# Patient Record
Sex: Female | Born: 1980 | Race: Black or African American | Hispanic: No | Marital: Married | Smoking: Never smoker
Health system: Southern US, Community
[De-identification: ages and names within clinical notes are randomized; demographics above are authoritative.]

## PROBLEM LIST (undated history)

## (undated) ENCOUNTER — Inpatient Hospital Stay (HOSPITAL_COMMUNITY): Payer: Self-pay

## (undated) DIAGNOSIS — J45909 Unspecified asthma, uncomplicated: Secondary | ICD-10-CM

## (undated) DIAGNOSIS — K219 Gastro-esophageal reflux disease without esophagitis: Secondary | ICD-10-CM

## (undated) HISTORY — PX: WISDOM TOOTH EXTRACTION: SHX21

## (undated) HISTORY — PX: TONSILLECTOMY: SUR1361

---

## 2018-06-21 ENCOUNTER — Encounter (HOSPITAL_COMMUNITY): Payer: Self-pay | Admitting: *Deleted

## 2018-06-21 ENCOUNTER — Inpatient Hospital Stay (HOSPITAL_COMMUNITY)
Admission: AD | Admit: 2018-06-21 | Discharge: 2018-06-21 | Disposition: A | Discharge status: Home / Self Care | Payer: BC Managed Care – PPO | Attending: Obstetrics & Gynecology | Admitting: Obstetrics & Gynecology

## 2018-06-21 ENCOUNTER — Other Ambulatory Visit: Payer: Self-pay

## 2018-06-21 DIAGNOSIS — O210 Mild hyperemesis gravidarum: Secondary | ICD-10-CM | POA: Insufficient documentation

## 2018-06-21 DIAGNOSIS — O2341 Unspecified infection of urinary tract in pregnancy, first trimester: Secondary | ICD-10-CM | POA: Insufficient documentation

## 2018-06-21 DIAGNOSIS — Z3A01 Less than 8 weeks gestation of pregnancy: Secondary | ICD-10-CM | POA: Diagnosis not present

## 2018-06-21 HISTORY — DX: Unspecified asthma, uncomplicated: J45.909

## 2018-06-21 LAB — POCT PREGNANCY, URINE: Preg Test, Ur: POSITIVE — AB

## 2018-06-21 LAB — URINALYSIS, ROUTINE W REFLEX MICROSCOPIC
Bilirubin Urine: NEGATIVE
Glucose, UA: NEGATIVE mg/dL
Hgb urine dipstick: NEGATIVE
Ketones, ur: 40 mg/dL — AB
Leukocytes,Ua: NEGATIVE
Nitrite: NEGATIVE
PH: 5.5 (ref 5.0–8.0)
Protein, ur: NEGATIVE mg/dL
Specific Gravity, Urine: >1.03 — ABNORMAL HIGH (ref 1.005–1.030)

## 2018-06-21 MED ORDER — SODIUM CHLORIDE 0.9 % IV SOLN
Freq: Once | INTRAVENOUS | Status: AC
  Administered 2018-06-21: 17:00:00 via INTRAVENOUS

## 2018-06-21 MED ORDER — SCOPOLAMINE 1 MG/3DAYS TD PT72
1.0000 | MEDICATED_PATCH | TRANSDERMAL | Status: DC
  Administered 2018-06-21: 1.5 mg via TRANSDERMAL
  Filled 2018-06-21: qty 1

## 2018-06-21 MED ORDER — PROMETHAZINE HCL 25 MG/ML IJ SOLN
25.0000 mg | Freq: Once | INTRAVENOUS | Status: AC
  Administered 2018-06-21: 25 mg via INTRAVENOUS
  Filled 2018-06-21: qty 1

## 2018-06-21 MED ORDER — SODIUM CHLORIDE 0.9 % IV SOLN
1.0000 g | INTRAVENOUS | Status: DC
  Administered 2018-06-21: 1 g via INTRAVENOUS
  Filled 2018-06-21: qty 10

## 2018-06-21 MED ORDER — M.V.I. ADULT IV INJ
Freq: Once | INTRAVENOUS | Status: AC
  Administered 2018-06-21: 20:00:00 via INTRAVENOUS
  Filled 2018-06-21: qty 1000

## 2018-06-21 MED ORDER — PROMETHAZINE HCL 25 MG PO TABS: 25.0000 mg | ORAL_TABLET | Freq: Four times a day (QID) | ORAL | 0 refills | Status: AC | PRN

## 2018-06-21 MED ORDER — SCOPOLAMINE 1 MG/3DAYS TD PT72: 1.0000 | MEDICATED_PATCH | TRANSDERMAL | 12 refills | Status: DC

## 2018-06-21 MED ORDER — FAMOTIDINE IN NACL 20-0.9 MG/50ML-% IV SOLN
20.0000 mg | Freq: Once | INTRAVENOUS | Status: AC
  Administered 2018-06-21: 20 mg via INTRAVENOUS
  Filled 2018-06-21: qty 50

## 2018-06-21 MED ORDER — LACTATED RINGERS IV SOLN: INTRAVENOUS | Status: DC

## 2018-06-21 NOTE — Discharge Instructions (Signed)
Safe Medications in Pregnancy   Acne: Benzoyl Peroxide Salicylic Acid  Backache/Headache: Tylenol: 2 regular strength every 4 hours OR              2 Extra strength every 6 hours  Colds/Coughs/Allergies: Benadryl (alcohol free) 25 mg every 6 hours as needed Breath right strips Claritin Cepacol throat lozenges Chloraseptic throat spray Cold-Eeze- up to three times per day Cough drops, alcohol free Flonase Guaifenesin Mucinex Robitussin DM (plain only, alcohol free) Saline nasal spray/drops Sudafed (pseudoephedrine) & Actifed ** use only after [redacted] weeks gestation and if you do not have high blood pressure Tylenol Vicks Vaporub Zinc lozenges Zyrtec   Constipation: Colace  Surfak - Docusate calcium Ducolax suppositories Fleet enema Glycerin suppositories Metamucil Milk of magnesia Miralax Senokot Smooth move tea  Diarrhea: Kaopectate Imodium A-D  *NO pepto Bismol  Hemorrhoids: Anusol Anusol HC Preparation H Tucks  Indigestion: Tums Maalox Mylanta Zantac  Pepcid  Insomnia: Benadryl (alcohol free) 25mg  every 6 hours as needed Tylenol PM Unisom, no Gelcaps  Leg Cramps: Tums MagGel  Nausea/Vomiting:  Bonine Dramamine Emetrol Ginger extract Sea bands Meclizine   Nausea medication to take during pregnancy:  Unisom (doxylamine succinate 25 mg tablets) Take one tablet daily at bedtime. If symptoms are not adequately controlled, the dose can be increased to a maximum recommended dose of two tablets daily (1/2 tablet in the morning, 1/2 tablet mid-afternoon and one at bedtime). Vitamin B6 100mg  tablets. Take one tablet twice a day (up to 200 mg per day).  If this is working, ask your doctor to prescribe Bonjesta or Diclegis  Skin Rashes: Aveeno products Benadryl cream or 25mg  every 6 hours as needed Calamine Lotion 1% cortisone cream  Yeast infection: Gyne-lotrimin 7 Monistat 7   **If taking multiple medications, please check labels to  avoid duplicating the same active ingredients **take medication as directed on the label ** Do not exceed 4000 mg of tylenol in 24 hours **Do not take medications that contain aspirin or ibuprofen

## 2018-06-21 NOTE — MAU Note (Signed)
UP TO B-ROOM

## 2018-06-21 NOTE — MAU Provider Note (Signed)
History     CSN: [id number]  Arrival date and time: 06/21/18 1528   First Provider Initiated Contact with Patient 06/21/18 1653      Chief Complaint  Patient presents with  . Dehydration  . Emesis   HPI Dawn Kent 38 y.o. [redacted]w[redacted]d  [facility] called report from Dr. Murrell Redden today and spoke with me prior to her arrival.  Was seen in the office today and has a UTI that they wanted treated with IV medication.  Client is currently having nausea and vomiting with this pregnancy and had nausea the entire pregnancy with her first child 8 years ago.  Office has a urine culture pending for her and will follow up after today for the UTI.  Client does not think she can keep down PO meds for UTI.  Has not yet started prenatal care but has an appointment.  Client reports she has lost 6-7 pounds since becoming pregnant.  OB History    Gravida  2   Para  1   Term  1   Preterm      AB      Living  1     SAB      TAB      Ectopic      Multiple      Live Births              Past Medical History:  Diagnosis Date  . Asthma    childhood    Past Surgical History:  Procedure Laterality Date  . TONSILLECTOMY    . WISDOM TOOTH EXTRACTION      No family history on file.  Social History   Tobacco Use  . Smoking status: Never Smoker  . Smokeless tobacco: Never Used  Substance Use Topics  . Alcohol use: Never    Frequency: Never  . Drug use: Never    Allergies: No Known Allergies  No medications prior to admission.    Review of Systems  Constitutional: Negative for fever.  Gastrointestinal: Positive for nausea and vomiting. Negative for abdominal pain.  Genitourinary: Negative for flank pain, vaginal bleeding and vaginal discharge.   Physical Exam   Blood pressure 116/75, pulse 76, temperature 98.2 F (36.8 C), temperature source Oral, resp. rate 16, weight 58.5 kg, last menstrual period 05/18/2018, SpO2 100 %.  Physical Exam  Nursing note and vitals  reviewed. Constitutional: She is oriented to person, place, and time. She appears well-developed and well-nourished.  Seems very worn out but moves well independently and can turn from side to side in bed without assistance.  HENT:  Head: Normocephalic.  Eyes: EOM are normal.  Neck: Neck supple.  Cardiovascular: Normal rate and regular rhythm.  Respiratory: Effort normal.  GI: Soft. There is no abdominal tenderness. There is no rebound and no guarding.  Musculoskeletal: Normal range of motion.  Neurological: She is alert and oriented to person, place, and time.  Skin: Skin is warm and dry.  Psychiatric: She has a normal mood and affect.    MAU Course  Procedures  MDM Reviewed medications she can take for nausea - Vitmain B6 and Unisom - will be in her discharge instructions.  Will also prescribe phenergan tablets - reviewed using them rectally or vaginally if she is having problems keeping down the tablets orally.  After getting Rocephin 1 gm IV and LR 1000cc with Phenergan 25 mg, client is still having nausea.  No vomiting in MAU.  Reports she vomited once today.  Was  still not improving so we added Pepcid 20 mg IV, scopolamine patch and 1000cc D5LR with multivitamins. Last IV fluids were infusing quickly but client stated she could not breathe well.  Had been up to the bathroom.  Slowed the IV infusion rate and client was better.  O2 sat was 100%. IV with nultivitamins infused with no problem.  Client able to walk to the bathroom after getting these medications.  Assessment and Plan  Hyperemesis requiring IVF and medications UTI  Plan Treated for UTI while here with Rocephin 1 gm IV Treated for hyperemesisi with Phenergan IV, Pepcid, IV and scopolamine patch Advised to wear the patch for 3 days.  Prescribed more for her Advised to try Vitamin B6 and Unisom at bedtime to decrease vomiting. Discussed taking small amount of food or liquieds every 2 hours through the day. Advised to  follow up at the office if she is not improviing. Prescribed phenergan tablets and talked about the routes she can use the medication - orally, rectally or vaginally. Client of [facility] and chart routed to the office.  Virginia Rochester 06/21/2018, 5:05 PM -2230

## 2018-06-21 NOTE — MAU Note (Signed)
Went to dr for nausea, can't keep anything down.  Was told she was dehydrated and had a UTI, was not given any rx. Pt is preg.  Sent over for fluids and medication for nausea

## 2018-06-22 ENCOUNTER — Other Ambulatory Visit: Payer: Self-pay | Admitting: Obstetrics and Gynecology

## 2018-07-13 ENCOUNTER — Other Ambulatory Visit (HOSPITAL_COMMUNITY): Payer: Self-pay | Admitting: Nurse Practitioner

## 2018-08-01 ENCOUNTER — Other Ambulatory Visit: Payer: Self-pay

## 2018-08-01 ENCOUNTER — Encounter (HOSPITAL_COMMUNITY): Payer: Self-pay

## 2018-08-01 ENCOUNTER — Inpatient Hospital Stay (HOSPITAL_COMMUNITY)
Admission: RE | Admit: 2018-08-01 | Discharge: 2018-08-01 | Disposition: A | Discharge status: Home / Self Care | Payer: BC Managed Care – PPO | Source: Ambulatory Visit | Attending: Obstetrics and Gynecology | Admitting: Obstetrics and Gynecology

## 2018-08-01 ENCOUNTER — Inpatient Hospital Stay (HOSPITAL_BASED_OUTPATIENT_CLINIC_OR_DEPARTMENT_OTHER): Payer: BC Managed Care – PPO

## 2018-08-01 DIAGNOSIS — O3411 Maternal care for benign tumor of corpus uteri, first trimester: Secondary | ICD-10-CM | POA: Diagnosis not present

## 2018-08-01 DIAGNOSIS — Z3A10 10 weeks gestation of pregnancy: Secondary | ICD-10-CM

## 2018-08-01 DIAGNOSIS — O99612 Diseases of the digestive system complicating pregnancy, second trimester: Secondary | ICD-10-CM | POA: Insufficient documentation

## 2018-08-01 DIAGNOSIS — D259 Leiomyoma of uterus, unspecified: Secondary | ICD-10-CM | POA: Insufficient documentation

## 2018-08-01 DIAGNOSIS — Z3A14 14 weeks gestation of pregnancy: Secondary | ICD-10-CM | POA: Diagnosis not present

## 2018-08-01 DIAGNOSIS — O3412 Maternal care for benign tumor of corpus uteri, second trimester: Secondary | ICD-10-CM

## 2018-08-01 DIAGNOSIS — O9989 Other specified diseases and conditions complicating pregnancy, childbirth and the puerperium: Secondary | ICD-10-CM

## 2018-08-01 DIAGNOSIS — K5900 Constipation, unspecified: Secondary | ICD-10-CM | POA: Insufficient documentation

## 2018-08-01 DIAGNOSIS — O26891 Other specified pregnancy related conditions, first trimester: Secondary | ICD-10-CM | POA: Diagnosis not present

## 2018-08-01 DIAGNOSIS — O26899 Other specified pregnancy related conditions, unspecified trimester: Secondary | ICD-10-CM

## 2018-08-01 DIAGNOSIS — R109 Unspecified abdominal pain: Secondary | ICD-10-CM | POA: Diagnosis not present

## 2018-08-01 DIAGNOSIS — K5901 Slow transit constipation: Secondary | ICD-10-CM

## 2018-08-01 LAB — CBC WITH DIFFERENTIAL/PLATELET
Abs Immature Granulocytes: 0 10*3/uL (ref 0.00–0.07)
Basophils Absolute: 0 10*3/uL (ref 0.0–0.1)
Basophils Relative: 0 %
Eosinophils Absolute: 0 10*3/uL (ref 0.0–0.5)
Eosinophils Relative: 0 %
HCT: 32.3 % — ABNORMAL LOW (ref 36.0–46.0)
Hemoglobin: 10.2 g/dL — ABNORMAL LOW (ref 12.0–15.0)
Lymphocytes Relative: 3 %
Lymphs Abs: 0.4 10*3/uL — ABNORMAL LOW (ref 0.7–4.0)
MCH: 24.8 pg — ABNORMAL LOW (ref 26.0–34.0)
MCHC: 31.6 g/dL (ref 30.0–36.0)
MCV: 78.4 fL — ABNORMAL LOW (ref 80.0–100.0)
MONOS PCT: 6 %
Monocytes Absolute: 0.9 10*3/uL (ref 0.1–1.0)
Neutro Abs: 13.6 10*3/uL — ABNORMAL HIGH (ref 1.7–7.7)
Neutrophils Relative %: 91 %
Platelets: 353 10*3/uL (ref 150–400)
RBC: 4.12 MIL/uL (ref 3.87–5.11)
RDW: 15.9 % — AB (ref 11.5–15.5)
WBC: 14.9 10*3/uL — ABNORMAL HIGH (ref 4.0–10.5)
nRBC: 0 % (ref 0.0–0.2)
nRBC: 0 /100 WBC

## 2018-08-01 LAB — WET PREP, GENITAL
Clue Cells Wet Prep HPF POC: NONE SEEN
SPERM: NONE SEEN
TRICH WET PREP: NONE SEEN
YEAST WET PREP: NONE SEEN

## 2018-08-01 MED ORDER — HYDROMORPHONE HCL 1 MG/ML IJ SOLN
1.0000 mg | Freq: Once | INTRAMUSCULAR | Status: AC
  Administered 2018-08-01: 1 mg via INTRAMUSCULAR
  Filled 2018-08-01: qty 1

## 2018-08-01 NOTE — MAU Note (Signed)
Pt wheeled back to Rm #[id number] and pt immediately asked to go to [facility].

## 2018-08-01 NOTE — Discharge Instructions (Signed)
Abdominal Pain During Pregnancy  Belly (abdominal) pain is common during pregnancy. There are many possible causes. Most of the time, it is not a serious problem. Other times, it can be a sign that something is wrong with the pregnancy. Always tell your doctor if you have belly pain. Follow these instructions at home:  Do not have sex or put anything in your vagina until your pain goes away completely.  Get plenty of rest until your pain gets better.  Drink enough fluid to keep your pee (urine) pale yellow.  Take over-the-counter and prescription medicines only as told by your doctor.  Keep all follow-up visits as told by your doctor. This is important. Contact a doctor if:  Your pain continues or gets worse after resting.  You have lower belly pain that: ? Comes and goes at regular times. ? Spreads to your back. ? Feels like menstrual cramps.  You have pain or burning when you pee (urinate). Get help right away if:  You have a fever or chills.  You have vaginal bleeding.  You are leaking fluid from your vagina.  You are passing tissue from your vagina.  You throw up (vomit) for more than 24 hours.  You have watery poop (diarrhea) for more than 24 hours.  Your baby is moving less than usual.  You feel very weak or faint.  You have shortness of breath.  You have very bad pain in your upper belly. Summary  Belly (abdominal) pain is common during pregnancy. There are many possible causes.  If you have belly pain during pregnancy, tell your doctor right away.  Keep all follow-up visits as told by your doctor. This is important. This information is not intended to replace advice given to you by your health care provider. Make sure you discuss any questions you have with your health care provider. Document Released: 04/09/2009 Document Revised: 07/24/2016 Document Reviewed: 07/24/2016 Elsevier Interactive Patient Education  2019 [employer].   Constipation,  Adult Constipation is when a person has fewer bowel movements in a week than normal, has difficulty having a bowel movement, or has stools that are dry, hard, or larger than normal. Constipation may be caused by an underlying condition. It may become worse with age if a person takes certain medicines and does not take in enough fluids. Follow these instructions at home: Eating and drinking   Eat foods that have a lot of fiber, such as fresh fruits and vegetables, whole grains, and beans.  Limit foods that are high in fat, low in fiber, or overly processed, such as french fries, hamburgers, cookies, candies, and soda.  Drink enough fluid to keep your urine clear or pale yellow. General instructions  Exercise regularly or as told by your health care provider.  Go to the restroom when you have the urge to go. Do not hold it in.  Take over-the-counter and prescription medicines only as told by your health care provider. These include any fiber supplements.  Practice pelvic floor retraining exercises, such as deep breathing while relaxing the lower abdomen and pelvic floor relaxation during bowel movements.  Watch your condition for any changes.  Keep all follow-up visits as told by your health care provider. This is important. Contact a health care provider if:  You have pain that gets worse.  You have a fever.  You do not have a bowel movement after 4 days.  You vomit.  You are not hungry.  You lose weight.  You are bleeding  from the anus.  You have thin, pencil-like stools. Get help right away if:  You have a fever and your symptoms suddenly get worse.  You leak stool or have blood in your stool.  Your abdomen is bloated.  You have severe pain in your abdomen.  You feel dizzy or you faint. This information is not intended to replace advice given to you by your health care provider. Make sure you discuss any questions you have with your health care provider. Document  Released: 01/18/2004 Document Revised: 11/09/2015 Document Reviewed: 10/10/2015 Elsevier Interactive Patient Education  2019 [employer].

## 2018-08-01 NOTE — MAU Provider Note (Addendum)
History     CSN: [id number]  Arrival date and time: 08/01/18 2122   First Provider Initiated Contact with Patient 08/01/18 [id number]      Chief Complaint  Patient presents with  . Abdominal Pain   HPI   [name] is a 38 y.o. female G2P1001 @ [redacted]w[redacted]d here in MAU with abdominal pain. She is a patient of Dr. Garwin Brothers at [facility] and was last seen there on Friday. She called and spoke to the on-call midwife who recommends she be evaluated in MAU. The pain started today. The pain is severe. The pain comes and goes. She has had trouble with constipation and has the urge to have a BM however when she uses the bathroom she has liquid stool. Last BM was 3-4 days ago. Admit to not drinking much water. Patient said they were able to hear heart tones in the office on Friday. No bleeding.   OB History    Gravida  2   Para  1   Term  1   Preterm      AB      Living  1     SAB      TAB      Ectopic      Multiple      Live Births              Past Medical History:  Diagnosis Date  . Asthma    childhood    Past Surgical History:  Procedure Laterality Date  . TONSILLECTOMY    . WISDOM TOOTH EXTRACTION      History reviewed. No pertinent family history.  Social History   Tobacco Use  . Smoking status: Never Smoker  . Smokeless tobacco: Never Used  Substance Use Topics  . Alcohol use: Never    Frequency: Never  . Drug use: Never    Allergies: No Known Allergies  No medications prior to admission.   Results for orders placed or performed during the hospital encounter of 08/01/18 (from the past 48 hour(s))  CBC with Differential/Platelet     Status: Abnormal   Collection Time: 08/01/18  8:28 AM  Result Value Ref Range   WBC 14.9 (H) 4.0 - 10.5 K/uL   RBC 4.12 3.87 - 5.11 MIL/uL   Hemoglobin 10.2 (L) 12.0 - 15.0 g/dL   HCT 32.3 (L) 36.0 - 46.0 %   MCV 78.4 (L) 80.0 - 100.0 fL   MCH 24.8 (L) 26.0 - 34.0 pg   MCHC 31.6 30.0 - 36.0 g/dL   RDW 15.9  (H) 11.5 - 15.5 %   Platelets 353 150 - 400 K/uL   nRBC 0.0 0.0 - 0.2 %   Neutrophils Relative % 91 %   Neutro Abs 13.6 (H) 1.7 - 7.7 K/uL   Lymphocytes Relative 3 %   Lymphs Abs 0.4 (L) 0.7 - 4.0 K/uL   Monocytes Relative 6 %   Monocytes Absolute 0.9 0.1 - 1.0 K/uL   Eosinophils Relative 0 %   Eosinophils Absolute 0.0 0.0 - 0.5 K/uL   Basophils Relative 0 %   Basophils Absolute 0.0 0.0 - 0.1 K/uL   nRBC 0 0 /100 WBC   Abs Immature Granulocytes 0.00 0.00 - 0.07 K/uL    Comment: Performed at [facility], [address], [place], [place] [address]  Wet prep, genital     Status: Abnormal   Collection Time: 08/01/18  8:31 AM  Result Value Ref Range   Yeast Wet  Prep HPF POC NONE SEEN NONE SEEN   Trich, Wet Prep NONE SEEN NONE SEEN   Clue Cells Wet Prep HPF POC NONE SEEN NONE SEEN   WBC, Wet Prep HPF POC FEW (A) NONE SEEN    Comment: MANY BACTERIA SEEN   Sperm NONE SEEN     Comment: Performed at [facility], [address] [place], [place] [address]  ABO/Rh     Status: None   Collection Time: 08/01/18  8:35 AM  Result Value Ref Range   ABO/RH(D)      A POS Performed at [facility] [address] [place], [place] [address]     Review of Systems  Constitutional: Negative for fever.  Gastrointestinal: Positive for abdominal pain and constipation. Negative for nausea and vomiting.  Genitourinary: Negative for vaginal bleeding, vaginal discharge and vaginal pain.   Physical Exam   Blood pressure 114/69, pulse 96, temperature 98.4 F (36.9 C), resp. rate 16, last menstrual period 05/18/2018, SpO2 100 %.  Physical Exam  Constitutional: She is oriented to person, place, and time. She appears well-developed and well-nourished.  Respiratory: Effort normal.  GI: Soft. She exhibits no distension and no mass. There is no abdominal tenderness. There is no rebound and no guarding.  Genitourinary:    Genitourinary Comments: Unable to determine Cervix dilation due to  stool  No blood noted on exam    Musculoskeletal: Normal range of motion.  Neurological: She is alert and oriented to person, place, and time.  Skin: Skin is warm.  Psychiatric: Her behavior is normal.   Results for orders placed or performed during the hospital encounter of 08/01/18 (from the past 24 hour(s))  CBC with Differential/Platelet     Status: Abnormal   Collection Time: 08/01/18  8:28 AM  Result Value Ref Range   WBC 14.9 (H) 4.0 - 10.5 K/uL   RBC 4.12 3.87 - 5.11 MIL/uL   Hemoglobin 10.2 (L) 12.0 - 15.0 g/dL   HCT 32.3 (L) 36.0 - 46.0 %   MCV 78.4 (L) 80.0 - 100.0 fL   MCH 24.8 (L) 26.0 - 34.0 pg   MCHC 31.6 30.0 - 36.0 g/dL   RDW 15.9 (H) 11.5 - 15.5 %   Platelets 353 150 - 400 K/uL   nRBC 0.0 0.0 - 0.2 %   Neutrophils Relative % 91 %   Neutro Abs 13.6 (H) 1.7 - 7.7 K/uL   Lymphocytes Relative 3 %   Lymphs Abs 0.4 (L) 0.7 - 4.0 K/uL   Monocytes Relative 6 %   Monocytes Absolute 0.9 0.1 - 1.0 K/uL   Eosinophils Relative 0 %   Eosinophils Absolute 0.0 0.0 - 0.5 K/uL   Basophils Relative 0 %   Basophils Absolute 0.0 0.0 - 0.1 K/uL   nRBC 0 0 /100 WBC   Abs Immature Granulocytes 0.00 0.00 - 0.07 K/uL  Wet prep, genital     Status: Abnormal   Collection Time: 08/01/18  8:31 AM  Result Value Ref Range   Yeast Wet Prep HPF POC NONE SEEN NONE SEEN   Trich, Wet Prep NONE SEEN NONE SEEN   Clue Cells Wet Prep HPF POC NONE SEEN NONE SEEN   WBC, Wet Prep HPF POC FEW (A) NONE SEEN   Sperm NONE SEEN   ABO/Rh     Status: None   Collection Time: 08/01/18  8:35 AM  Result Value Ref Range   ABO/RH(D)      A POS Performed at  [facility], [place] [address], [place], [place] [address]    MAU Course  Procedures  None  MDM  RN attempted to [facility] Dr. Jackqulyn Livings 2, no return call back to MAU MSE done by MAU provider, [place] ordered due to patient's discomfort Dilaudid 1 Mg IM ordered  Report given to [place] who resumes care of the patient. [place] results pending.   Noni Saupe, FNP 08/01/18, [phone]  [place] and labs reviewed. Viable IUP on [place], 3 uterine fibroids present. Pain is improved after meds. Discussed with pt constipation and/or fibroids may be the cause of her pain. She reports inconsistently using meds for constipation. Recommend Miralax bid until having BMs then can change to QD, and she must increase her water intake to 5-6 bottles per day. Encouraged to add dietary fiber such as fruits and veggies. She was unable to give urine specimen for UA. Stable for discharge home.   Assessment and Plan  [redacted] weeks gestation Constipation Uterine fibroids Discharge home Follow up with Dr. Garwin Brothers as scheduled Miralax OTC Tylenol prn SAB precautions  Allergies as of 08/01/2018   No Known Allergies     Medication List    TAKE these medications   promethazine 25 MG tablet Commonly known as:  PHENERGAN Take 1 tablet (25 mg total) by mouth every 6 (six) hours as needed for nausea or vomiting.   scopolamine 1 MG/3DAYS Commonly known as:  TRANSDERM-SCOP Place 1 patch (1.5 mg total) onto the skin every 3 (three) days.      Julianne Handler, CNM  08/01/2018 9:45 AM

## 2018-08-01 NOTE — MAU Note (Signed)
Woke up couple hours ago having severe abd cramping. Denies vag bleeding but having some yellow vag d/c.

## 2018-08-02 ENCOUNTER — Other Ambulatory Visit: Payer: Self-pay | Admitting: Obstetrics and Gynecology

## 2018-08-02 LAB — GC/CHLAMYDIA PROBE AMP (~~LOC~~) NOT AT ARMC
Chlamydia: NEGATIVE
Neisseria Gonorrhea: NEGATIVE

## 2018-08-02 LAB — ABO/RH: ABO/RH(D): A POS

## 2018-09-01 ENCOUNTER — Other Ambulatory Visit: Payer: Self-pay | Admitting: Obstetrics and Gynecology

## 2018-09-01 DIAGNOSIS — Z419 Encounter for procedure for purposes other than remedying health state, unspecified: Secondary | ICD-10-CM

## 2018-09-02 ENCOUNTER — Other Ambulatory Visit: Payer: Self-pay

## 2018-09-02 ENCOUNTER — Encounter (HOSPITAL_BASED_OUTPATIENT_CLINIC_OR_DEPARTMENT_OTHER): Payer: Self-pay | Admitting: *Deleted

## 2018-09-02 ENCOUNTER — Encounter (HOSPITAL_BASED_OUTPATIENT_CLINIC_OR_DEPARTMENT_OTHER)
Admission: RE | Admit: 2018-09-02 | Discharge: 2018-09-02 | Disposition: A | Discharge status: Home / Self Care | Payer: BC Managed Care – PPO | Source: Ambulatory Visit | Attending: Obstetrics and Gynecology | Admitting: Obstetrics and Gynecology

## 2018-09-02 DIAGNOSIS — Z79899 Other long term (current) drug therapy: Secondary | ICD-10-CM | POA: Diagnosis not present

## 2018-09-02 DIAGNOSIS — O99612 Diseases of the digestive system complicating pregnancy, second trimester: Secondary | ICD-10-CM | POA: Diagnosis not present

## 2018-09-02 DIAGNOSIS — O3412 Maternal care for benign tumor of corpus uteri, second trimester: Secondary | ICD-10-CM | POA: Diagnosis not present

## 2018-09-02 DIAGNOSIS — Z3A18 18 weeks gestation of pregnancy: Secondary | ICD-10-CM | POA: Diagnosis not present

## 2018-09-02 DIAGNOSIS — O364XX0 Maternal care for intrauterine death, not applicable or unspecified: Secondary | ICD-10-CM | POA: Diagnosis not present

## 2018-09-02 DIAGNOSIS — O99012 Anemia complicating pregnancy, second trimester: Secondary | ICD-10-CM | POA: Diagnosis not present

## 2018-09-02 DIAGNOSIS — D259 Leiomyoma of uterus, unspecified: Secondary | ICD-10-CM | POA: Diagnosis not present

## 2018-09-02 DIAGNOSIS — K219 Gastro-esophageal reflux disease without esophagitis: Secondary | ICD-10-CM | POA: Diagnosis not present

## 2018-09-02 DIAGNOSIS — O09522 Supervision of elderly multigravida, second trimester: Secondary | ICD-10-CM | POA: Diagnosis not present

## 2018-09-02 DIAGNOSIS — Z8709 Personal history of other diseases of the respiratory system: Secondary | ICD-10-CM | POA: Diagnosis not present

## 2018-09-02 LAB — TYPE AND SCREEN
ABO/RH(D): A POS
Antibody Screen: NEGATIVE

## 2018-09-02 LAB — CBC
HCT: 31 % — ABNORMAL LOW (ref 36.0–46.0)
Hemoglobin: 9.7 g/dL — ABNORMAL LOW (ref 12.0–15.0)
MCH: 25 pg — ABNORMAL LOW (ref 26.0–34.0)
MCHC: 31.3 g/dL (ref 30.0–36.0)
MCV: 79.9 fL — ABNORMAL LOW (ref 80.0–100.0)
Platelets: 319 10*3/uL (ref 150–400)
RBC: 3.88 MIL/uL (ref 3.87–5.11)
RDW: 14.7 % (ref 11.5–15.5)
WBC: 5.2 10*3/uL (ref 4.0–10.5)
nRBC: 0 % (ref 0.0–0.2)

## 2018-09-02 NOTE — Progress Notes (Signed)
Ensure Pre-Surgery drink given to patient with instructions to complete by 0945 DOS.  Patient verbalized understanding of instructions.

## 2018-09-03 ENCOUNTER — Encounter (HOSPITAL_BASED_OUTPATIENT_CLINIC_OR_DEPARTMENT_OTHER): Payer: Self-pay | Admitting: Anesthesiology

## 2018-09-03 ENCOUNTER — Ambulatory Visit (HOSPITAL_BASED_OUTPATIENT_CLINIC_OR_DEPARTMENT_OTHER)
Admission: RE | Admit: 2018-09-03 | Discharge: 2018-09-03 | Disposition: A | Discharge status: Home / Self Care | Payer: BC Managed Care – PPO | Attending: Obstetrics and Gynecology | Admitting: Obstetrics and Gynecology

## 2018-09-03 ENCOUNTER — Other Ambulatory Visit: Payer: Self-pay

## 2018-09-03 ENCOUNTER — Encounter (HOSPITAL_BASED_OUTPATIENT_CLINIC_OR_DEPARTMENT_OTHER)
Admission: RE | Disposition: A | Discharge status: Home / Self Care | Payer: Self-pay | Source: Home / Self Care | Attending: Obstetrics and Gynecology

## 2018-09-03 ENCOUNTER — Ambulatory Visit (HOSPITAL_BASED_OUTPATIENT_CLINIC_OR_DEPARTMENT_OTHER): Payer: BC Managed Care – PPO | Admitting: Anesthesiology

## 2018-09-03 ENCOUNTER — Ambulatory Visit (HOSPITAL_COMMUNITY): Payer: BC Managed Care – PPO

## 2018-09-03 DIAGNOSIS — Z3A18 18 weeks gestation of pregnancy: Secondary | ICD-10-CM | POA: Insufficient documentation

## 2018-09-03 DIAGNOSIS — O99612 Diseases of the digestive system complicating pregnancy, second trimester: Secondary | ICD-10-CM | POA: Insufficient documentation

## 2018-09-03 DIAGNOSIS — Z8709 Personal history of other diseases of the respiratory system: Secondary | ICD-10-CM | POA: Insufficient documentation

## 2018-09-03 DIAGNOSIS — O364XX0 Maternal care for intrauterine death, not applicable or unspecified: Secondary | ICD-10-CM | POA: Diagnosis not present

## 2018-09-03 DIAGNOSIS — K219 Gastro-esophageal reflux disease without esophagitis: Secondary | ICD-10-CM | POA: Insufficient documentation

## 2018-09-03 DIAGNOSIS — O3412 Maternal care for benign tumor of corpus uteri, second trimester: Secondary | ICD-10-CM | POA: Insufficient documentation

## 2018-09-03 DIAGNOSIS — D259 Leiomyoma of uterus, unspecified: Secondary | ICD-10-CM | POA: Insufficient documentation

## 2018-09-03 DIAGNOSIS — O99012 Anemia complicating pregnancy, second trimester: Secondary | ICD-10-CM | POA: Insufficient documentation

## 2018-09-03 DIAGNOSIS — O09299 Supervision of pregnancy with other poor reproductive or obstetric history, unspecified trimester: Secondary | ICD-10-CM

## 2018-09-03 DIAGNOSIS — Z79899 Other long term (current) drug therapy: Secondary | ICD-10-CM | POA: Insufficient documentation

## 2018-09-03 DIAGNOSIS — O09522 Supervision of elderly multigravida, second trimester: Secondary | ICD-10-CM | POA: Insufficient documentation

## 2018-09-03 HISTORY — PX: DILATION AND EVACUATION: SHX1459

## 2018-09-03 HISTORY — PX: OPERATIVE ULTRASOUND: SHX5996

## 2018-09-03 HISTORY — DX: Gastro-esophageal reflux disease without esophagitis: K21.9

## 2018-09-03 SURGERY — DILATION AND EVACUATION, UTERUS
Anesthesia: Monitor Anesthesia Care | Site: Vagina

## 2018-09-03 MED ORDER — MISOPROSTOL 200 MCG PO TABS
ORAL_TABLET | ORAL | Status: AC
  Filled 2018-09-03: qty 5

## 2018-09-03 MED ORDER — DEXAMETHASONE SODIUM PHOSPHATE 4 MG/ML IJ SOLN
INTRAMUSCULAR | Status: DC | PRN
  Administered 2018-09-03: 10 mg via INTRAVENOUS

## 2018-09-03 MED ORDER — SCOPOLAMINE 1 MG/3DAYS TD PT72
1.0000 | MEDICATED_PATCH | Freq: Once | TRANSDERMAL | Status: DC | PRN
  Administered 2018-09-03: 1.5 mg via TRANSDERMAL

## 2018-09-03 MED ORDER — DEXAMETHASONE SODIUM PHOSPHATE 10 MG/ML IJ SOLN
INTRAMUSCULAR | Status: AC
  Filled 2018-09-03: qty 1

## 2018-09-03 MED ORDER — FENTANYL CITRATE (PF) 100 MCG/2ML IJ SOLN: 50.0000 ug | Freq: Once | INTRAMUSCULAR | Status: DC

## 2018-09-03 MED ORDER — CEFAZOLIN SODIUM-DEXTROSE 2-4 GM/100ML-% IV SOLN
INTRAVENOUS | Status: AC
  Filled 2018-09-03: qty 100

## 2018-09-03 MED ORDER — BUPIVACAINE HCL (PF) 0.25 % IJ SOLN
INTRAMUSCULAR | Status: AC
  Filled 2018-09-03: qty 30

## 2018-09-03 MED ORDER — SILVER NITRATE-POT NITRATE 75-25 % EX MISC
CUTANEOUS | Status: AC
  Filled 2018-09-03: qty 1

## 2018-09-03 MED ORDER — BUPIVACAINE HCL (PF) 0.25 % IJ SOLN
INTRAMUSCULAR | Status: DC | PRN
  Administered 2018-09-03: 30 mL

## 2018-09-03 MED ORDER — FENTANYL CITRATE (PF) 100 MCG/2ML IJ SOLN: 25.0000 ug | INTRAMUSCULAR | Status: DC | PRN

## 2018-09-03 MED ORDER — CEFAZOLIN SODIUM-DEXTROSE 2-4 GM/100ML-% IV SOLN
2.0000 g | INTRAVENOUS | Status: AC
  Administered 2018-09-03: 2 g via INTRAVENOUS

## 2018-09-03 MED ORDER — MIDAZOLAM HCL 2 MG/2ML IJ SOLN
INTRAMUSCULAR | Status: AC
  Filled 2018-09-03: qty 2

## 2018-09-03 MED ORDER — OXYCODONE HCL 5 MG PO TABS: 5.0000 mg | ORAL_TABLET | Freq: Four times a day (QID) | ORAL | 0 refills | Status: DC | PRN

## 2018-09-03 MED ORDER — PROPOFOL 10 MG/ML IV BOLUS
INTRAVENOUS | Status: DC | PRN
  Administered 2018-09-03: 200 mg via INTRAVENOUS

## 2018-09-03 MED ORDER — LACTATED RINGERS IV SOLN
INTRAVENOUS | Status: DC
  Administered 2018-09-03 (×2): via INTRAVENOUS

## 2018-09-03 MED ORDER — SCOPOLAMINE 1 MG/3DAYS TD PT72
MEDICATED_PATCH | TRANSDERMAL | Status: AC
  Filled 2018-09-03: qty 1

## 2018-09-03 MED ORDER — FENTANYL CITRATE (PF) 100 MCG/2ML IJ SOLN
INTRAMUSCULAR | Status: AC
  Filled 2018-09-03: qty 2

## 2018-09-03 MED ORDER — METHYLERGONOVINE MALEATE 0.2 MG/ML IJ SOLN
INTRAMUSCULAR | Status: AC
  Filled 2018-09-03: qty 1

## 2018-09-03 MED ORDER — OXYCODONE HCL 5 MG PO TABS: 5.0000 mg | ORAL_TABLET | Freq: Once | ORAL | Status: DC | PRN

## 2018-09-03 MED ORDER — ONDANSETRON HCL 4 MG/2ML IJ SOLN: 4.0000 mg | Freq: Once | INTRAMUSCULAR | Status: DC | PRN

## 2018-09-03 MED ORDER — PROPOFOL 500 MG/50ML IV EMUL
INTRAVENOUS | Status: AC
  Filled 2018-09-03: qty 50

## 2018-09-03 MED ORDER — LIDOCAINE 2% (20 MG/ML) 5 ML SYRINGE
INTRAMUSCULAR | Status: AC
  Filled 2018-09-03: qty 5

## 2018-09-03 MED ORDER — OXYCODONE HCL 5 MG/5ML PO SOLN: 5.0000 mg | Freq: Once | ORAL | Status: DC | PRN

## 2018-09-03 MED ORDER — METHYLERGONOVINE MALEATE 0.2 MG/ML IJ SOLN
INTRAMUSCULAR | Status: DC | PRN
  Administered 2018-09-03: 0.2 mg via INTRAMUSCULAR

## 2018-09-03 MED ORDER — FENTANYL CITRATE (PF) 100 MCG/2ML IJ SOLN
50.0000 ug | INTRAMUSCULAR | Status: DC | PRN
  Administered 2018-09-03: 50 ug via INTRAVENOUS

## 2018-09-03 MED ORDER — ONDANSETRON HCL 4 MG/2ML IJ SOLN
INTRAMUSCULAR | Status: AC
  Filled 2018-09-03: qty 2

## 2018-09-03 MED ORDER — DEXTROSE 50 % IV SOLN
INTRAVENOUS | Status: AC
  Filled 2018-09-03: qty 50

## 2018-09-03 MED ORDER — PROPOFOL 10 MG/ML IV BOLUS
INTRAVENOUS | Status: AC
  Filled 2018-09-03: qty 20

## 2018-09-03 MED ORDER — MIDAZOLAM HCL 2 MG/2ML IJ SOLN: 1.0000 mg | INTRAMUSCULAR | Status: DC | PRN

## 2018-09-03 MED ORDER — OXYTOCIN 10 UNIT/ML IJ SOLN
INTRAMUSCULAR | Status: AC
  Filled 2018-09-03: qty 2

## 2018-09-03 MED ORDER — LIDOCAINE HCL (CARDIAC) PF 100 MG/5ML IV SOSY
PREFILLED_SYRINGE | INTRAVENOUS | Status: DC | PRN
  Administered 2018-09-03: 60 mg via INTRAVENOUS

## 2018-09-03 MED ORDER — FENTANYL CITRATE (PF) 100 MCG/2ML IJ SOLN
50.0000 ug | Freq: Once | INTRAMUSCULAR | Status: AC
  Administered 2018-09-03: 50 ug via INTRAVENOUS

## 2018-09-03 MED ORDER — ONDANSETRON HCL 4 MG/2ML IJ SOLN
INTRAMUSCULAR | Status: DC | PRN
  Administered 2018-09-03: 4 mg via INTRAVENOUS

## 2018-09-03 SURGICAL SUPPLY — 27 items
BAG URINE DRAINAGE (UROLOGICAL SUPPLIES) IMPLANT
CATH FOLEY 2WAY SLVR  5CC 14FR (CATHETERS)
CATH FOLEY 2WAY SLVR 5CC 14FR (CATHETERS) IMPLANT
CATH ROBINSON RED A/P 16FR (CATHETERS) ×3 IMPLANT
COVER WAND RF STERILE (DRAPES) IMPLANT
FILTER UTR ASPR ASSEMBLY (MISCELLANEOUS) ×3 IMPLANT
GLOVE BIO SURGEON STRL SZ7.5 (GLOVE) ×3 IMPLANT
GOWN STRL REUS W/TWL XL LVL3 (GOWN DISPOSABLE) ×3 IMPLANT
HOSE CONNECTING 18IN BERKELEY (TUBING) ×3 IMPLANT
NS IRRIG 1000ML POUR BTL (IV SOLUTION) ×3 IMPLANT
PACK VAGINAL MINOR WOMEN LF (CUSTOM PROCEDURE TRAY) ×3 IMPLANT
PAD OB MATERNITY 4.3X12.25 (PERSONAL CARE ITEMS) ×3 IMPLANT
PAD PREP 24X48 CUFFED NSTRL (MISCELLANEOUS) ×3 IMPLANT
SCOPETTES 8  STERILE (MISCELLANEOUS)
SCOPETTES 8 STERILE (MISCELLANEOUS) IMPLANT
SET BERKELEY SUCTION TUBING (SUCTIONS) IMPLANT
SLEEVE SCD COMPRESS KNEE MED (MISCELLANEOUS) ×3 IMPLANT
TOWEL GREEN STERILE FF (TOWEL DISPOSABLE) ×6 IMPLANT
TRAP TISSUE FILTER (MISCELLANEOUS) IMPLANT
TUBE CONNECTING 20'X1/4 (TUBING)
TUBE CONNECTING 20X1/4 (TUBING) IMPLANT
TUBE VACURETTE 2ND TRIMESTER (CANNULA) ×3 IMPLANT
VACURETTE 10 RIGID CVD (CANNULA) IMPLANT
VACURETTE 14MM CVD 1/2 BASE (CANNULA) ×3 IMPLANT
VACURETTE 7MM CVD STRL WRAP (CANNULA) IMPLANT
VACURETTE 8 RIGID CVD (CANNULA) IMPLANT
VACURETTE 9 RIGID CVD (CANNULA) IMPLANT

## 2018-09-03 NOTE — Transfer of Care (Signed)
Immediate Anesthesia Transfer of Care Note  Patient: Dawn Kent  Procedure(s) Performed: DILATATION AND EVACUATION (N/A Vagina ) OPERATIVE ULTRASOUND (N/A Vagina )  Patient Location: PACU  Anesthesia Type:General  Level of Consciousness: sedated and patient cooperative  Airway & Oxygen Therapy: Patient Spontanous Breathing and Patient connected to nasal cannula oxygen  Post-op Assessment: Report given to RN and Post -op Vital signs reviewed and stable  Post vital signs: Reviewed and stable  Last Vitals:  Vitals Value Taken Time  BP 112/74 09/03/2018  1:57 PM  Temp    Pulse 90 09/03/2018  1:58 PM  Resp 12 09/03/2018  1:58 PM  SpO2 100 % 09/03/2018  1:58 PM  Vitals shown include unvalidated device data.  Last Pain:  Vitals:   09/03/18 1201  TempSrc: Oral  PainSc: 4          Complications: No apparent anesthesia complications

## 2018-09-03 NOTE — Op Note (Signed)
NAMEKAHLEY, LEIB MEDICAL RECORD [id number] ACCOUNT [id number] DATE OF BIRTH:11-21-1980 FACILITY: [facility] LOCATION: [facility] PHYSICIAN:Mehmet Scally J. Clarrissa Shimkus, MD  OPERATIVE REPORT  DATE OF PROCEDURE:  09/03/2018  PREOPERATIVE DIAGNOSIS:  Intrauterine fetal demise at 60 weeks.  POSTOPERATIVE DIAGNOSIS:  Intrauterine fetal demise at 2 weeks.  PROCEDURES:   1.  Late trimester dilatation and evacuation. 2.  Operative ultrasound for guidance.  SURGEON:  Brien Few, MD  ASSISTANT:  None.  ANESTHESIA:  General.  ESTIMATED BLOOD LOSS:  Less than 50 mL.  COMPLICATIONS:  None.  DRAINS:  None.  COUNTS:  Correct.  DISPOSITION:  The patient was taken to recovery in good condition.  BRIEF OPERATIVE NOTE:  After being apprised of the risks of anesthesia, infection, bleeding, and surrounding organs, possible need for repair, delayed versus immediate complications including bowel and bladder, internal vessel injury, possible need for  repair, the patient was brought to the operating room where she was administered a general anesthetic without complications.  Prepped and draped in usual sterile fashion.  Catheterized until the bladder was empty.  Exam under anesthesia reveals about  20-50 mL of blood clot in the vagina and bulging membranes at the external cervical os.  At this time, the weighted retractor was placed and products of conception were extracted using Sopher forceps and ring forceps grasping the anterior lip of the  cervix with ring forceps for a gentle delivery of an intact fetus.  Suction and blunt curettage were used to completely retrieve and remove the placenta under ultrasound guidance.  Ultrasound guided curettage revealed the cavity to be empty.  No obvious  endometrial defects were noted.  At this time, good hemostasis was noted.  Subsequently,  the dilute bupivacaine solution was placed., 30 mL total for standard paracervical block.    The patient tolerated the  procedure well.  Methergine was given.  Minimal bleeding noted.  The patient was transferred to recovery in good condition.  AN/NUANCE  D:09/03/2018 T:09/03/2018 JOB:[id number]

## 2018-09-03 NOTE — Anesthesia Procedure Notes (Signed)
Procedure Name: LMA Insertion Date/Time: 09/03/2018 1:25 PM Performed by: Lyndee Leo, CRNA Pre-anesthesia Checklist: Patient identified, Emergency Drugs available, Suction available and Patient being monitored Patient Re-evaluated:Patient Re-evaluated prior to induction Oxygen Delivery Method: Circle system utilized Preoxygenation: Pre-oxygenation with 100% oxygen Induction Type: IV induction Ventilation: Mask ventilation without difficulty LMA: LMA inserted LMA Size: 4.0 Number of attempts: 1 Airway Equipment and Method: Bite block Placement Confirmation: positive ETCO2 Tube secured with: Tape Dental Injury: Teeth and Oropharynx as per pre-operative assessment

## 2018-09-03 NOTE — Anesthesia Postprocedure Evaluation (Signed)
Anesthesia Post Note  Patient: Dawn Kent  Procedure(s) Performed: DILATATION AND EVACUATION (N/A Vagina ) OPERATIVE ULTRASOUND (N/A Vagina )     Patient location during evaluation: PACU Anesthesia Type: General Level of consciousness: awake and alert Pain management: pain level controlled Vital Signs Assessment: post-procedure vital signs reviewed and stable Respiratory status: spontaneous breathing, nonlabored ventilation and respiratory function stable Cardiovascular status: blood pressure returned to baseline and stable Postop Assessment: no apparent nausea or vomiting Anesthetic complications: no    Last Vitals:  Vitals:   09/03/18 1415 09/03/18 1430  BP: 113/82 112/74  Pulse: 84 73  Resp: 18 10  Temp:    SpO2: 100% 100%    Last Pain:  Vitals:   09/03/18 1415  TempSrc:   PainSc: 0-No pain                 Lidia Collum

## 2018-09-03 NOTE — Progress Notes (Signed)
Patient seen and examined. Consent witnessed and signed. No changes noted. Update completed. CBC    Component Value Date/Time   WBC 5.2 09/02/2018 1635   RBC 3.88 09/02/2018 1635   HGB 9.7 (L) 09/02/2018 1635   HCT 31.0 (L) 09/02/2018 1635   PLT 319 09/02/2018 1635   MCV 79.9 (L) 09/02/2018 1635   MCH 25.0 (L) 09/02/2018 1635   MCHC 31.3 09/02/2018 1635   RDW 14.7 09/02/2018 1635   LYMPHSABS 0.4 (L) 08/01/2018 0828   MONOABS 0.9 08/01/2018 0828   EOSABS 0.0 08/01/2018 0828   BASOSABS 0.0 08/01/2018 0828    BP 107/68   Pulse (!) 118   Temp 98.5 F (36.9 C) (Oral)   Resp 18   Ht 5\' 1"  (1.549 m)   Wt 58 kg   LMP 05/18/2018   SpO2 100%   BMI 24.16 kg/m

## 2018-09-03 NOTE — Discharge Instructions (Signed)
DISCHARGE INSTRUCTIONS: the following instructions have been prepared to help you care for yourself upon your return home.   Personal hygiene:  Use sanitary pads for vaginal drainage, not tampons.  Shower the day after your procedure.  NO tub baths, pools or Jacuzzis for 2-3 weeks.  Wipe front to back after using the bathroom.  Activity and limitations:  Do NOT drive or operate any equipment for 24 hours. The effects of anesthesia are still present and drowsiness may result.  Do NOT rest in bed all day.  Walking is encouraged.  Walk up and down stairs slowly.  You may resume your normal activity in one to two days or as indicated by your physician.  Sexual activity: NO intercourse for at least 2 weeks after the procedure, or as indicated by your physician.  Diet: Eat a light meal as desired this evening. You may resume your usual diet tomorrow.  Return to work: You may resume your work activities in one to two days or as indicated by your doctor.  What to expect after your surgery: Expect to have vaginal bleeding/discharge for 2-3 days and spotting for up to 10 days. You may have a slight burning sensation when you urinate for the first day. Mild cramps may continue for a couple of days. You may have a regular period in 2-6 weeks.  Call your doctor for any of the following:  Excessive vaginal bleeding, saturating and changing one pad every hour.  Inability to urinate 6 hours after discharge from hospital.  Pain not relieved by pain medication.  Fever of 100.4 F or greater.  Unusual vaginal discharge or odor.    Post Anesthesia Home Care Instructions  Activity: Get plenty of rest for the remainder of the day. A responsible individual must stay with you for 24 hours following the procedure.  For the next 24 hours, DO NOT: -Drive a car -Paediatric nurse -Drink alcoholic beverages -Take any medication unless instructed by your physician -Make any legal decisions  or sign important papers.  Meals: Start with liquid foods such as gelatin or soup. Progress to regular foods as tolerated. Avoid greasy, spicy, heavy foods. If nausea and/or vomiting occur, drink only clear liquids until the nausea and/or vomiting subsides. Call your physician if vomiting continues.  Special Instructions/Symptoms: Your throat may feel dry or sore from the anesthesia or the breathing tube placed in your throat during surgery. If this causes discomfort, gargle with warm salt water. The discomfort should disappear within 24 hours.  If you had a scopolamine patch placed behind your ear for the management of post- operative nausea and/or vomiting:  1. The medication in the patch is effective for 72 hours, after which it should be removed.  Wrap patch in a tissue and discard in the trash. Wash hands thoroughly with soap and water. 2. You may remove the patch earlier than 72 hours if you experience unpleasant side effects which may include dry mouth, dizziness or visual disturbances. 3. Avoid touching the patch. Wash your hands with soap and water after contact with the patch.

## 2018-09-03 NOTE — Op Note (Signed)
09/03/2018  1:45 PM  PATIENT:  Dawn Kent  38 y.o. female  PRE-OPERATIVE DIAGNOSIS:  18 WEEK INTRAUTERINE FETAL DEMISE  POST-OPERATIVE DIAGNOSIS:  18 WEEK INTRAUTERINE FETAL DEMISE  PROCEDURE:  Procedure(s): DILATATION AND EVACUATION-late trimester OPERATIVE ULTRASOUND  SURGEON:  Surgeon(s): Brien Few, MD  ASSISTANTS: none   ANESTHESIA:   local and general  ESTIMATED BLOOD LOSS: 50cc   DRAINS: none   LOCAL MEDICATIONS USED:  MARCAINE    and Amount: 30 ml  SPECIMEN:  Source of Specimen:  POC  DISPOSITION OF SPECIMEN:  PATHOLOGY  COUNTS:  YES  DICTATION #: [id number]  PLAN OF CARE: dc home  PATIENT DISPOSITION:  PACU - hemodynamically stable.

## 2018-09-03 NOTE — Anesthesia Preprocedure Evaluation (Signed)
Anesthesia Evaluation  Patient identified by MRN, date of birth, ID band Patient awake    Reviewed: Allergy & Precautions, NPO status , Patient's Chart, lab work & pertinent test results  History of Anesthesia Complications Negative for: history of anesthetic complications  Airway Mallampati: II  TM Distance: >3 FB Neck ROM: Full    Dental no notable dental hx. (+) Teeth Intact   Pulmonary neg pulmonary ROS,    Pulmonary exam normal        Cardiovascular negative cardio ROS Normal cardiovascular exam     Neuro/Psych negative neurological ROS  negative psych ROS   GI/Hepatic Neg liver ROS, GERD  ,  Endo/Other  negative endocrine ROS  Renal/GU negative Renal ROS  negative genitourinary   Musculoskeletal negative musculoskeletal ROS (+)   Abdominal   Peds  Hematology negative hematology ROS (+)   Anesthesia Other Findings   Reproductive/Obstetrics                             Anesthesia Physical Anesthesia Plan  ASA: II  Anesthesia Plan: MAC   Post-op Pain Management:    Induction:   PONV Risk Score and Plan: 2 and Propofol infusion and Treatment may vary due to age or medical condition  Airway Management Planned: Natural Airway and Simple Face Mask  Additional Equipment: None  Intra-op Plan:   Post-operative Plan:   Informed Consent: I have reviewed the patients History and Physical, chart, labs and discussed the procedure including the risks, benefits and alternatives for the proposed anesthesia with the patient or authorized representative who has indicated his/her understanding and acceptance.       Plan Discussed with:   Anesthesia Plan Comments:         Anesthesia Quick Evaluation

## 2018-09-03 NOTE — Progress Notes (Signed)
Pt in pre op is actively bleeding. Pads changed several times. Vital signs are stable. Pt is on continuous pulse and ox receiving oxygen at [facility].  Pt received Fentanyl 100 mcg for cramping abdominal pain.

## 2018-09-03 NOTE — H&P (Signed)
Chanette Demo is an 38 y.o. female. IUFD at 27 wks.   Pertinent Gynecological History: Menses: flow is moderate Bleeding: dysfunctional uterine bleeding Contraception: none DES exposure: denies Blood transfusions: none Sexually transmitted diseases: no past history Previous GYN Procedures: DNC  Last mammogram: na Date: na Last pap: normal Date: 2020 OB History: G2, P1   Menstrual History: Menarche age: 55 Patient's last menstrual period was 05/18/2018.    Past Medical History:  Diagnosis Date  . Asthma    childhood  . GERD (gastroesophageal reflux disease)     Past Surgical History:  Procedure Laterality Date  . TONSILLECTOMY    . WISDOM TOOTH EXTRACTION      History reviewed. No pertinent family history.  Social History:  reports that she has never smoked. She has never used smokeless tobacco. She reports that she does not drink alcohol or use drugs.  Allergies: No Known Allergies  Medications Prior to Admission  Medication Sig Dispense Refill Last Dose  . omeprazole (PRILOSEC) 20 MG capsule Take 20 mg by mouth daily.   09/02/2018 at Unknown time  . promethazine (PHENERGAN) 25 MG tablet Take 1 tablet (25 mg total) by mouth every 6 (six) hours as needed for nausea or vomiting. 30 tablet 0 More than a month at Unknown time    Review of Systems  Constitutional: Negative.   All other systems reviewed and are negative.   Blood pressure 107/68, pulse (!) 118, temperature 98.5 F (36.9 C), temperature source Oral, resp. rate 18, height 5\' 1"  (1.549 m), weight 58 kg, last menstrual period 05/18/2018, SpO2 100 %. Physical Exam  Nursing note and vitals reviewed. Constitutional: She is oriented to person, place, and time. She appears well-developed and well-nourished.  HENT:  Head: Normocephalic and atraumatic.  Neck: Normal range of motion. Neck supple.  Cardiovascular: Normal rate and regular rhythm.  Respiratory: Effort normal and breath sounds normal.  GI: Soft.  Bowel sounds are normal.  Genitourinary:    Vagina and uterus normal.   Musculoskeletal: Normal range of motion.  Neurological: She is alert and oriented to person, place, and time. She has normal reflexes.  Skin: Skin is warm and dry.  Psychiatric: She has a normal mood and affect.    Results for orders placed or performed during the hospital encounter of 09/03/18 (from the past 24 hour(s))  Type and screen     Status: None   Collection Time: 09/02/18  2:40 PM  Result Value Ref Range   ABO/RH(D) A POS    Antibody Screen NEG    Sample Expiration      09/05/2018 Performed at [facility], [place] [address], [place], [place] [address]   CBC     Status: Abnormal   Collection Time: 09/02/18  4:35 PM  Result Value Ref Range   WBC 5.2 4.0 - 10.5 K/uL   RBC 3.88 3.87 - 5.11 MIL/uL   Hemoglobin 9.7 (L) 12.0 - 15.0 g/dL   HCT 31.0 (L) 36.0 - 46.0 %   MCV 79.9 (L) 80.0 - 100.0 fL   MCH 25.0 (L) 26.0 - 34.0 pg   MCHC 31.3 30.0 - 36.0 g/dL   RDW 14.7 11.5 - 15.5 %   Platelets 319 150 - 400 K/uL   nRBC 0.0 0.0 - 0.2 %    No results found.  Assessment/Plan: IUFD at 18wks Anemia Fibroids Later Trimester D&E. Consent done. Sono guided.   Dorothee Napierkowski J 09/03/2018, 1:07 PM

## 2018-09-06 ENCOUNTER — Encounter (HOSPITAL_BASED_OUTPATIENT_CLINIC_OR_DEPARTMENT_OTHER): Payer: Self-pay | Admitting: Obstetrics and Gynecology

## 2018-09-23 LAB — CHROMOSOME STD, POC(TISSUE)-NCBH

## 2020-01-17 IMAGING — US US MFM OB LIMITED
1 series · 14 of 28 positions shown · non-contrast
Comparison: none

[Series 1: us mfm ob limited · 46 acquisitions, 14 frames shown]
[im 2/46]
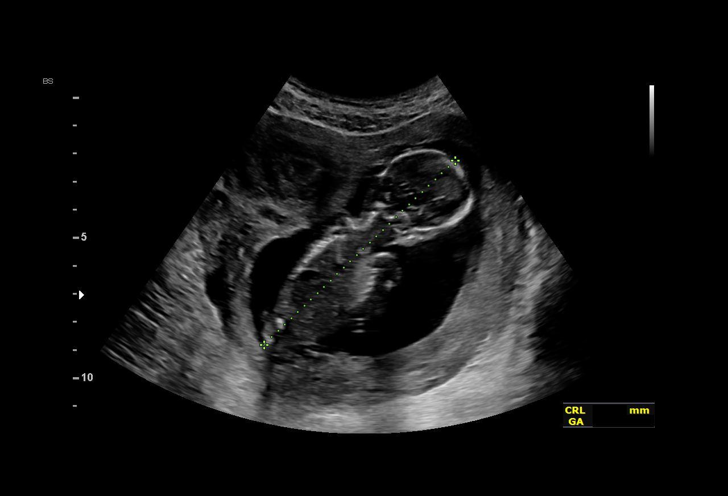
[im 6/46]
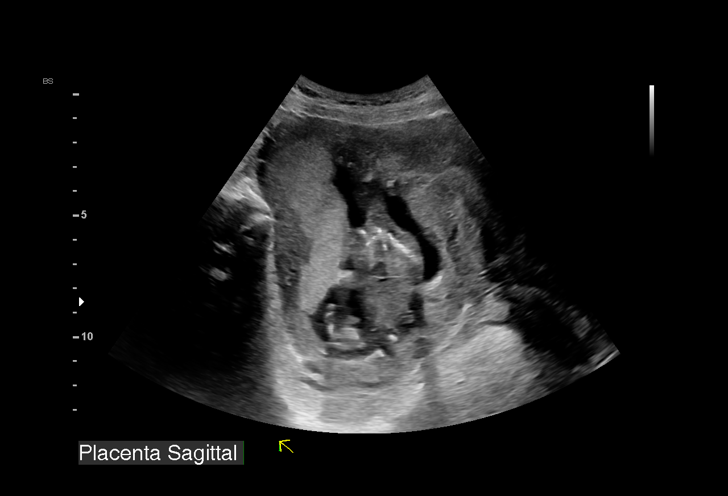
[im 9/46]
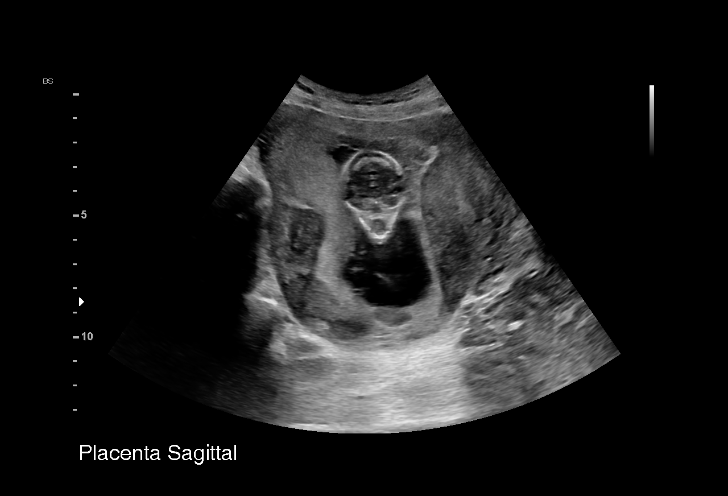
[im 12/46]
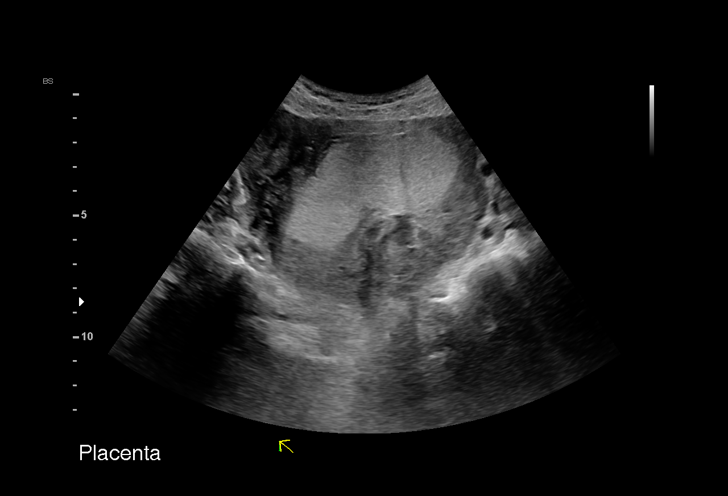
[im 16/46]
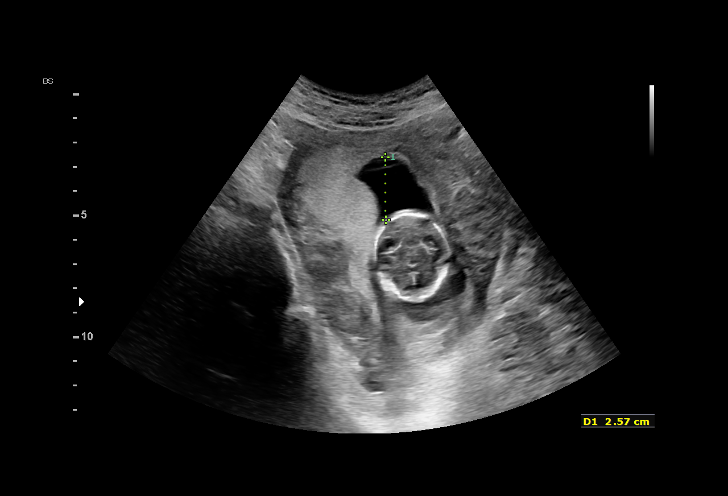
[im 19/46]
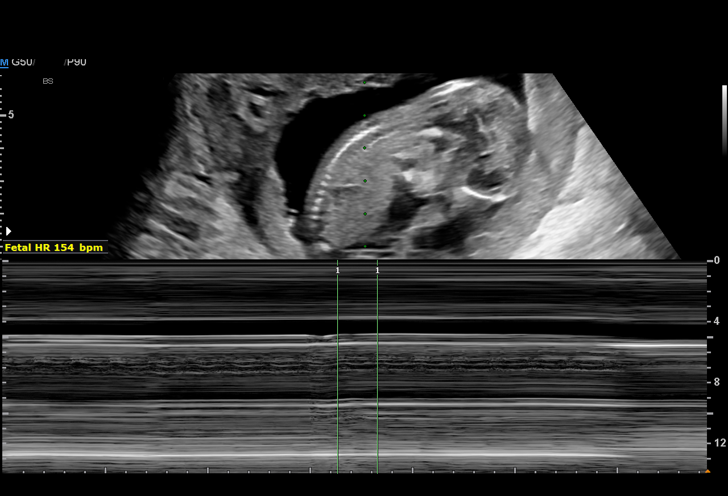
[im 22/46]
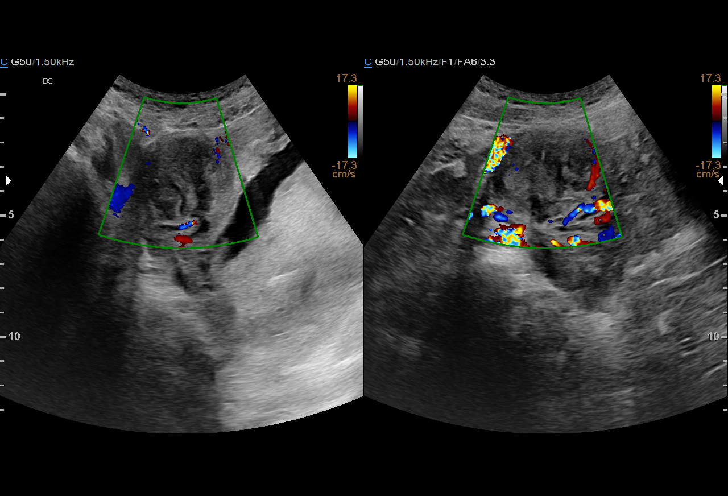
[im 26/46]
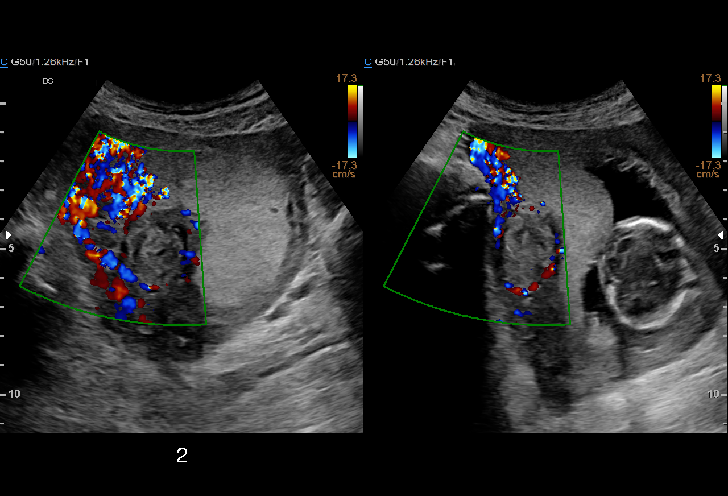
[im 29/46]
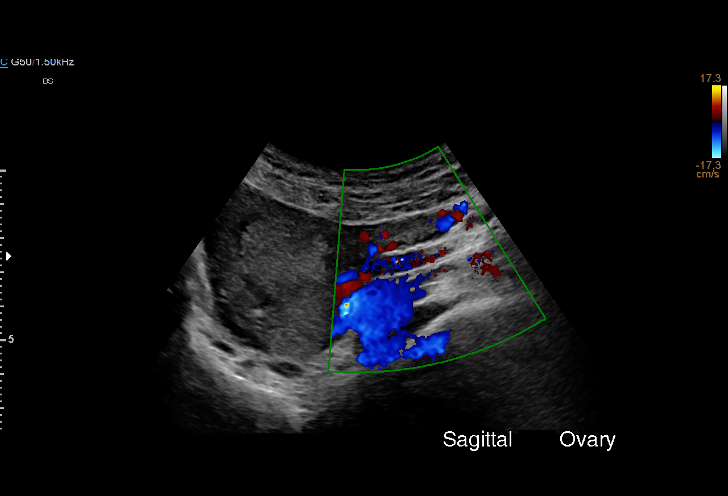
[im 32/46]
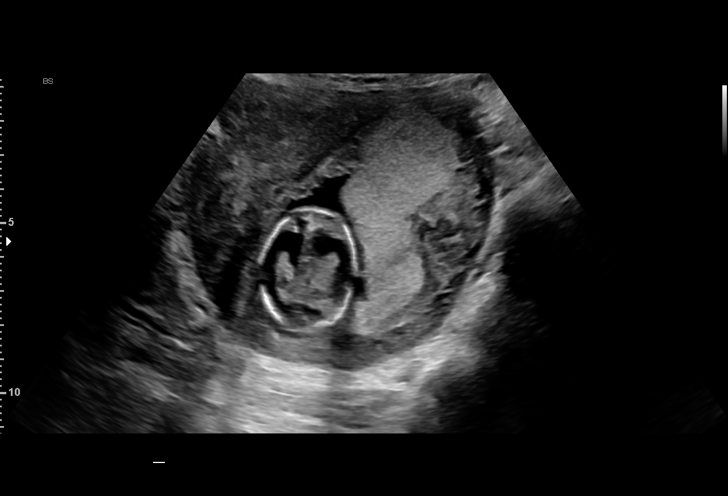
[im 36/46]
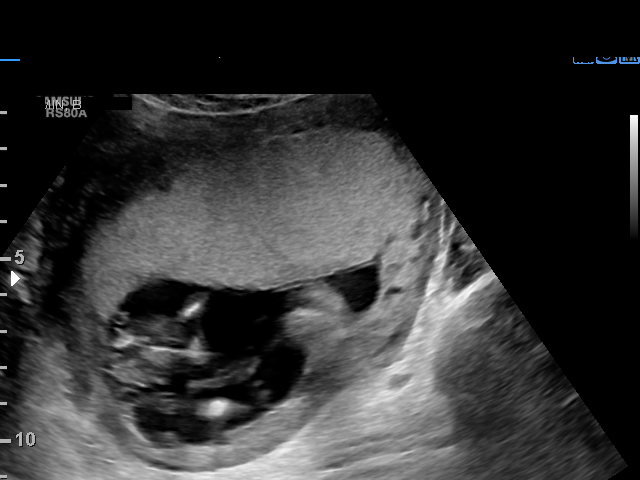
[im 39/46]
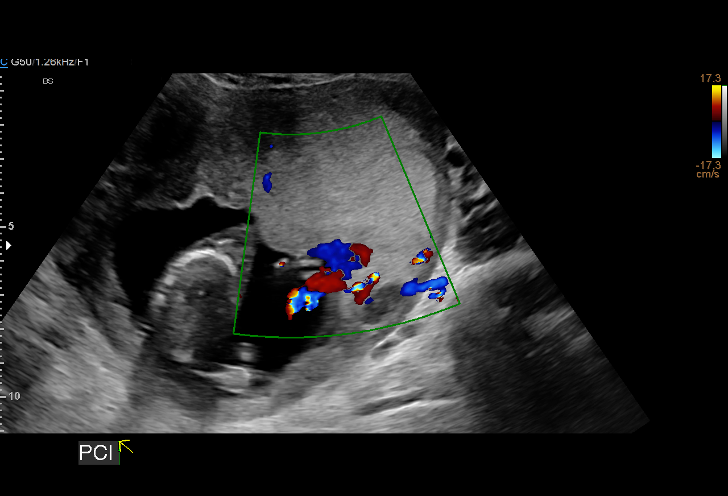
[im 42/46]
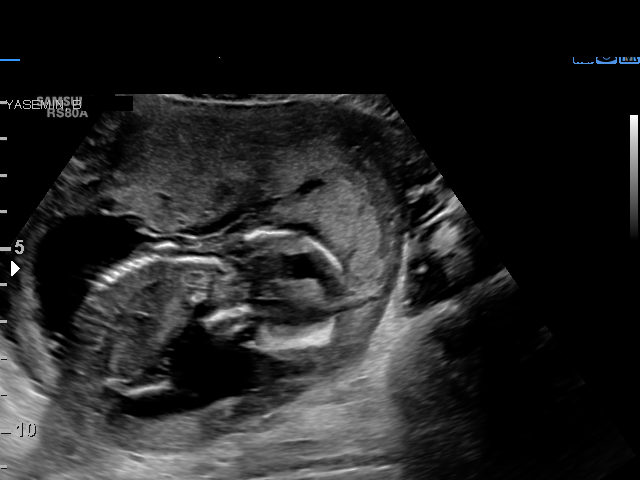
[im 46/46]
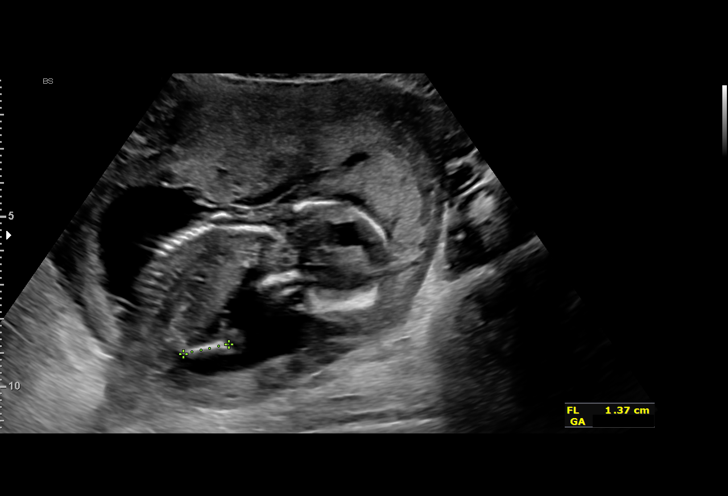

[14 of 28 positions shown; findings below may reference images not displayed]

NP

  1  US MFM OB LIMITED                     76815.01     IRUFAAU SULEYM
 ----------------------------------------------------------------------

 ----------------------------------------------------------------------
Indications

  Abdominal-back pain in pregnancy
  14 weeks gestation of pregnancy
 ----------------------------------------------------------------------
Fetal Evaluation

 Num Of Fetuses:          1
 Fetal Heart Rate(bpm):   154
 Cardiac Activity:        Observed
 Presentation:            Breech
 Placenta:                Posterior Fundal
 P. Cord Insertion:       Visualized, central

 Amniotic Fluid
 AFI FV:      Within normal limits

                             Largest Pocket(cm)

Biometry

 CRL:     93.83   mm     G. Age:  N/A                      EDD:

 BPD:      28.1   mm     G. Age:  15w 0d                  CI:         66.46  %    70 - 86
                                                          FL/HC:       12.4  %    15.3 -
 HC:      110.5   mm     G. Age:  15w 2d
                                                          FL/BPD:      48.8  %
 FL:       13.7   mm     G. Age:  14w 0d
OB History
 Gravidity:     2         Term:  1
 Living:        1
Gestational Age

 LMP:            10w 5d       Date:  05/18/18                   EDD:  02/22/19
 U/S Today:      14w 5d                                         EDD:  01/25/19
 Best:           14w 5d    Det. By:  U/S (08/01/18)             EDD:  01/25/19
Cervix Uterus Adnexa

 Cervix
 Normal appearance by transabdominal scan.

 Uterus
 Multiple fibroids noted, see table below.

 Left Ovary
 Size(cm)      3.66      1.07       1.9       Vol(ml):
 Within normal limits.

 Right Ovary
 Size(cm)       3.7      1.67       1.56      Vol(ml):
 Within normal limits.

 Cul De Sac
 No free fluid seen.

 Adnexa
 No abnormality visualized.
Myomas

  Site                     L(cm)       W(cm)      D(cm)       Location
  Fundus-RT
  Posterior
  Posterior
 ----------------------------------------------------------------------

  Blood Flow                  RI       PI        Comments

 ----------------------------------------------------------------------
Impression

 Early gestation
 EDD is not consistent with prior dating.
 The pregnancy is dated by today's examination.
Recommendations

 Consider follow up anatomy in 5-6 weeks.

## 2020-08-09 ENCOUNTER — Encounter: Payer: Self-pay | Admitting: Obstetrics and Gynecology

## 2020-09-13 ENCOUNTER — Other Ambulatory Visit: Payer: Self-pay | Admitting: Obstetrics and Gynecology

## 2020-09-18 ENCOUNTER — Encounter: Payer: Self-pay | Admitting: *Deleted

## 2020-09-18 ENCOUNTER — Other Ambulatory Visit: Payer: Self-pay | Admitting: Obstetrics and Gynecology

## 2020-09-18 DIAGNOSIS — O285 Abnormal chromosomal and genetic finding on antenatal screening of mother: Secondary | ICD-10-CM

## 2020-09-19 ENCOUNTER — Encounter: Payer: Self-pay | Admitting: *Deleted

## 2020-09-19 ENCOUNTER — Ambulatory Visit: Payer: BC Managed Care – PPO | Admitting: *Deleted

## 2020-09-19 ENCOUNTER — Ambulatory Visit: Payer: BC Managed Care – PPO | Attending: Obstetrics and Gynecology

## 2020-09-19 ENCOUNTER — Other Ambulatory Visit: Payer: Self-pay | Admitting: Obstetrics and Gynecology

## 2020-09-19 ENCOUNTER — Ambulatory Visit (HOSPITAL_BASED_OUTPATIENT_CLINIC_OR_DEPARTMENT_OTHER): Payer: BC Managed Care – PPO | Admitting: Genetic Counselor

## 2020-09-19 ENCOUNTER — Other Ambulatory Visit: Payer: Self-pay

## 2020-09-19 VITALS — BP 111/56 | HR 76

## 2020-09-19 DIAGNOSIS — O285 Abnormal chromosomal and genetic finding on antenatal screening of mother: Secondary | ICD-10-CM | POA: Diagnosis not present

## 2020-09-19 DIAGNOSIS — Z315 Encounter for genetic counseling: Secondary | ICD-10-CM

## 2020-09-19 DIAGNOSIS — O321XX0 Maternal care for breech presentation, not applicable or unspecified: Secondary | ICD-10-CM

## 2020-09-19 DIAGNOSIS — O280 Abnormal hematological finding on antenatal screening of mother: Secondary | ICD-10-CM | POA: Insufficient documentation

## 2020-09-19 DIAGNOSIS — O09522 Supervision of elderly multigravida, second trimester: Secondary | ICD-10-CM

## 2020-09-19 DIAGNOSIS — Z363 Encounter for antenatal screening for malformations: Secondary | ICD-10-CM

## 2020-09-19 DIAGNOSIS — Z3A14 14 weeks gestation of pregnancy: Secondary | ICD-10-CM

## 2020-09-19 NOTE — Progress Notes (Signed)
09/19/2020  Clarinda Obi 12-Nov-1980 MRN: [id number] DOV: 09/19/2020  Ms. Steelman presented to the [facility] for a genetics consultation regarding her noninvasive prenatal screening (NIPS) results that were high-risk for trisomy 1. Ms. Buster was accompanied to her appointment by her partner, Hollace Hayward.   Indication for genetic counseling - NIPS high-risk for trisomy 33  Prenatal history  Ms. Saccente is a G34P1001, 40 y.o. female. Her current pregnancy has completed [redacted]w[redacted]d (Estimated Date of Delivery: 03/16/21). Ms. Kham and her partner have a 67 year old daughter together. They also had a spontaneous miscarriage around 14-15 weeks' gestation. Chromosomal analysis was reportedly ordered on products of conception and was normal.   Ms. Vanleeuwen denied exposure to environmental toxins or chemical agents. She denied the use of alcohol, tobacco or street drugs. She reported taking prenatal vitamins, Tylenol, promethazine, and omeprazole. She denied significant viral illnesses and fevers during the course of her pregnancy. She reported spotting around 7 weeks' gestation. She had a D&E procedure following her miscarriage in May 2020. Her medical and surgical histories were otherwise noncontributory.  Family History  A three generation pedigree was drafted and reviewed. The family history is remarkable for the following:  - Ms. Sweeting has a maternal first cousin with arthrogryposis. Arthrogryposis is a term describing a number of conditions that affect the joints. The cause of arthrogryposis is often unknown. In about 30% of cases, a genetic cause can be found. We discussed that even when this is the case, arthrogryposis is often de novo (occurring sporadically) and may not occur more than once in a family. However, the risk of recurrence varies with the type of genetic disorder. Ms. Bowerman was not certain if her cousin's condition has a known genetic  cause. Without information about the underlying etiology of her cousin's arthrogryposis, precise risk assessment is limited.  - Ms. Twombly has another maternal first cousin with learning delays. We discussed that many times, learning difficulties are multifactorial in nature, occurring due to a combination of genetic and environmental factors that are challenging to identify. Due to the multifactorial nature of learning difficulties, precise risk assessment is difficult.  - Mr. Jimmye Norman, his mother, and a maternal uncle have a history of sarcoidosis. We discussed that sarcoidosis likely results from complex interactions between environmental and genetic factors. Familial clustering is common in sarcoidosis as seen in Mr. Bebe Liter family. We discussed that based on the family history, the couple's children are likely at increased risk of developing sarcoidosis themselves.   The remaining family histories were reviewed and found to be noncontributory for birth defects, intellectual disability, recurrent pregnancy loss, and known genetic conditions. Mr. Jimmye Norman had limited information about portions of his family history; thus, risk assessment was limited.   The patient's ancestry is African American. The father of the pregnancy's ancestry is African American. Ashkenazi Jewish ancestry and consanguinity were denied. Pedigree will be scanned under Media.  Discussion  NIPS result:   Ms. Offner was referred for genetic counseling as results from noninvasive prenatal screening (NIPS) came back positive for an increased risk of trisomy 35, AKA Down syndrome, in the current pregnancy. Down syndrome is one of the most common extra chromosome conditions, as approximately 1 in 800 babies are born with this condition. We reviewed that there are different types of Down syndrome, with each type determined by the arrangement of the chromosome 21 pair. Approximately 95% of cases are caused by an entire extra  copy of chromosome 21 (  trisomy 21), 2-4% of cases are due to a chromosomal rearrangement (translocation) involving chromosome 21, and 1% are due to trisomy 21 mosaicism. We reviewed that Down syndrome most commonly occurs by chance due to an error in chromosomal division during the formation of egg and sperm cells in a process called nondisjunction. Nondisjunction occurs more frequently with maternal age.   Down syndrome is characterized by a distinctive facial appearance, mild to moderate intellectual disability, and an increased chance for a heart defect. Approximately half of babies with Down syndrome are born with a heart defect that may require surgery after birth. While many children with Down syndrome look similar to each other, each child with Down syndrome is unique and will have many more features in common with his or her own family members. Children with Down syndrome also have an increased chance for thyroid problems, which can range from an underactive to an overactive thyroid. Additionally, low muscle tone, gastrointestinal abnormalities, vision problems, and respiratory and ear infections are more common among babies with Down syndrome. We discussed that there are many more features that can be associated with Down syndrome; however, it is not possible to accurately predict all features that would be present in an individual with Down syndrome prenatally. Additionally, there is a high degree of variability seen among children who have this condition, meaning that every child with Down syndrome will not be affected in exactly the same way, and some children will have more or less features than others. It is not possible to predict what strengths and weaknesses a child with Down syndrome will have, just like it is not possible to predict this for any child.    There is also an increased fetal loss rate associated with Down syndrome, with up to 30% miscarrying between 12 weeks' gestation and term.  Of affected liveborn infants, 90% survive the first 10 years of life. With the advances in medical technology, early intervention, and supportive therapies, many individuals with Down syndrome are able to live with an increasing degree of independence. Today, many adults with Down syndrome care for themselves, have jobs, and often live in group homes or apartments where assistance is available if needed.   We reviewed that NIPS analyzes cell free DNA originating from the placenta that is found in the maternal blood circulation during pregnancy. This test can provide information regarding the presence or absence of extra DNA for chromosomes 13, 18 and 21 as well as the sex chromosomes. The reported detection rate is 95-99% for trisomies 21, 18, and 13, and >70% for sex chromosome aneuploidies. However, it cannot be considered diagnostic for chromosome conditions. Positive predictive value (PPV) is the probability that a pregnancy with a positive test result is truly affected. The PPV reported by the NIPS laboratory for trisomy 21 in the current pregnancy was estimated to be 95%. Thus, there is a ~5% chance that this could be a false positive result.   Ms. Partipilo was counseled that there are several possible explanations for her high risk NIPS result. Firstly, the fetus could truly be affected by Down syndrome. Secondly, the fetus could be mosaic for Down syndrome, though this is rare. Mosaicism occurs when an individual has two or more genetically different sets of cells in their body. Individuals who are mosaic for Down syndrome have some cells in the body with trisomy 21 and may have other cells that are chromosomally normal or have another chromosomal complement. We discussed that it would not be possible to  tell which features an individual with mosaic Down syndrome may have, as it is impossible to assess which specific cells and tissues in the body have trisomy 21. Individuals who are mosaic for Down  syndrome may be more mildly affected than individuals with full trisomy 21, though this is not always the case. Thirdly, the placenta could have Down syndrome while the fetus could be unaffected. This is a phenomenon known as confined placental mosaicism (CPM). Lastly, this could be a false positive result for reasons unknown.  Ultrasound:  A complete ultrasound was performed today following our visit. The ultrasound report will be sent under separate cover. An absent nasal bone was noted on today's ultrasound. During Ms. Zuno's visit, we had discussed that an absent nasal bone can be seen in fetuses with Down syndrome.  Ms. Everly was counseled that ~50% of fetuses with Down syndrome demonstrate a sign of the condition by the 18-20 week detailed anatomy ultrasound. Thus, a normal-appearing ultrasound does not rule out the possibility of fetal Down syndrome.  Diagnostic testing:   Ms. Storr was also counseled regarding diagnostic testing via amniocentesis available from 16 weeks' gestation. We discussed the technical aspects of the procedure and quoted up to a 1 in 500 (0.2%) risk for spontaneous pregnancy loss or other adverse pregnancy outcomes as a result of amniocentesis. Fluorescent in situ hybridization (FISH) can provide a preliminary answer as to whether or not trisomy 21 is suspected in a fetus; however, it is not considered diagnostic, and could miss cases of Down syndrome caused by other chromosomal rearrangements. Cultured cells from an amniocentesis sample allow for the visualization of a fetal karyotype, which can detect >99% of large chromosomal aberrations, including trisomy 21. Chromosomal microarray can also be performed to identify smaller deletions or duplications of fetal chromosomal material. Ms. Benton was informed that diagnostic testing would be the only way to definitively determine if the fetus has Down syndrome prenatally.   We reviewed that if results from  amniocentesis were to come back positive, it could impact pregnancy management in several different ways. We discussed that some individuals may choose to end a pregnancy or consider adoption if a diagnosis of Down syndrome were confirmed in the fetus. The couple was informed that it is an option to end a pregnancy until [redacted]w[redacted]d in the state of [place] depending on the clinic. Termination is still an option beyond this timepoint in other states. For individuals who would not alter their pregnancy management regardless of testing outcomes, a prenatal diagnosis could allow for delivery planing and prenatal consults with specialists that would be involved in the infant's care, and time to plan and prepare emotionally, physically, and financially. Additionally, diagnostic testing can help to inform recurrence risks for future pregnancies. Diagnostic testing could also aid in determining the level of mosaicism that is present if the fetus is mosaic for Down syndrome.  Ms. Gangwer informed me that she will likely pursue amniocentesis to definitively determine if the fetus has Down syndrome. 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 She and her partner also reported limited social support that would be available to them in caring for a child with special needs. Although it would be an incredibly difficult choice for the couple, they disclosed that they would likely terminate the pregnancy should a diagnosis of Down syndrome be confirmed on amniocentesis.  Carrier screening:  Per ACOG recommendation, carrier screening for hemoglobinopathies, cystic fibrosis (CF)  and spinal muscular atrophy (SMA) was discussed including information about the conditions, rationale for testing, autosomal recessive inheritance, and the option of prenatal diagnosis. Ms. Patnaude had a negative hemoglobin electrophoresis, significantly reducing her risk to be a  carrier for a hemoglobinopathy such as sickle cell disease. Ms. Farone thought she recalled her OBGYN provider offering her carrier screening for CF and SMA; however, she could not remember if a sample was drawn for this. She would also qualify for alpha-thalassemia carrier screening given her low MCV (69.8). I offered additional carrier screening for these conditions, which Ms. Abruzzese declined at this time. She would prefer to focus on coping with her NIPS result and may consider additional carrier screening at a later point if it has not already been completed.  Plan:  Following Ms. Toves's ultrasound today, she opted to schedule an amniocentesis on 10/04/20. FISH and karyotype analysis can be ordered at that time, with cells held for possible chromosomal microarray should Ms. Virella desire it or should the other chromosomal analyses be normal. Results from [facility] take 48-72 hours to be returned. Results from karyotype take 1-2 weeks to be returned. Myself or my colleagues will call Ms. Privette as her results become available.  In the meantime, I offered the couple additional resources to take home, which they appreciated. I provided them with information on Down syndrome from the [employer]. They also desired social support resources for individuals who have experienced pregnancy loss as well as for couples who have been faced with a decision to possibly terminate a desired pregnancy. I provided them with information on the [employer] and Pregnancy Loss Support organization as well as links to family stories on the websites Ending a Wanted Pregnancy and A Heartbreaking Choice. I also recommended the book A Time to Decide, A Time to Heal.  I counseled Ms. Wilton regarding the above risks and available options. The approximate face-to-face time with the genetic counselor was 50 minutes.  In summary:  Discussed NIPS result  NIPS high-risk for trisomy 21 AKA Down syndrome (PPV  95%)  Reviewed information about Down syndrome, including possible features, prognosis, and cause  Offered additional testing and screening  Amniocentesis scheduled in [facility] on 6/2  Declined carrier screening for CF, SMA, and alpha-thalassemia. May consider in future if this has not already been completed  Reviewed family history concerns   Buelah Manis, MS, [facility] Genetic Counselor

## 2020-09-20 ENCOUNTER — Ambulatory Visit: Payer: BC Managed Care – PPO

## 2020-09-24 ENCOUNTER — Ambulatory Visit: Payer: Self-pay

## 2020-10-04 ENCOUNTER — Other Ambulatory Visit: Payer: BC Managed Care – PPO

## 2020-10-04 ENCOUNTER — Other Ambulatory Visit: Payer: Self-pay

## 2020-10-04 ENCOUNTER — Ambulatory Visit: Payer: BC Managed Care – PPO

## 2020-10-04 ENCOUNTER — Ambulatory Visit: Payer: BC Managed Care – PPO | Admitting: *Deleted

## 2020-10-04 ENCOUNTER — Ambulatory Visit: Payer: BC Managed Care – PPO | Attending: Obstetrics and Gynecology

## 2020-10-04 ENCOUNTER — Encounter: Payer: Self-pay | Admitting: *Deleted

## 2020-10-04 VITALS — BP 101/63 | HR 71

## 2020-10-04 DIAGNOSIS — Z3A16 16 weeks gestation of pregnancy: Secondary | ICD-10-CM

## 2020-10-04 DIAGNOSIS — O285 Abnormal chromosomal and genetic finding on antenatal screening of mother: Secondary | ICD-10-CM

## 2020-10-04 DIAGNOSIS — O322XX0 Maternal care for transverse and oblique lie, not applicable or unspecified: Secondary | ICD-10-CM | POA: Diagnosis not present

## 2020-10-04 DIAGNOSIS — O09522 Supervision of elderly multigravida, second trimester: Secondary | ICD-10-CM

## 2020-10-04 DIAGNOSIS — Z36 Encounter for antenatal screening for chromosomal anomalies: Secondary | ICD-10-CM | POA: Insufficient documentation

## 2020-10-04 DIAGNOSIS — O283 Abnormal ultrasonic finding on antenatal screening of mother: Secondary | ICD-10-CM | POA: Insufficient documentation

## 2020-10-09 ENCOUNTER — Other Ambulatory Visit: Payer: Self-pay

## 2020-10-09 ENCOUNTER — Ambulatory Visit: Payer: BC Managed Care – PPO | Attending: Obstetrics and Gynecology

## 2020-10-09 DIAGNOSIS — Q909 Down syndrome, unspecified: Secondary | ICD-10-CM | POA: Diagnosis not present

## 2020-10-09 NOTE — Progress Notes (Signed)
I met with Dawn Kent and her partner, Dawn Kent, today to review results of the [place] testing from her recent amniocentesis and to discuss options for this pregnancy. The patient is currently 45w3dpregnant.  She previously had genetic counseling to review the results of non-invasive prenatal screening (NIPS) which showed an increased risk for Trisomy 21, or Down syndrome, in the pregnancy. She elected to have an amniocentesis on 10/04/20 with Dr. SDonalee Citrin  Testing ordered on that sample included: FISH for chromosomes 13, 18 and 21, X and Y, karyotype, AFAFP and maternal cell contamination with the option to reflex to chromosomal microarray if needed.  FISH results are now available and were given to Dawn Kent by phone yesterday with the option of speaking in person today, which she desired.  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  The number of signals for chromosomes 13, 18, X and Y were normal. Karyotype is still pending, and should be available within the week.  Though the combination of FISH, positive NIPS results and absent nasal bone on ultrasound are all highly suggestive of this diagnosis, it is recommended that no final decisions be made about the outcome of the pregnancy until the full karyotype is available.  If the karyotype confirms the diagnosis of Trisomy 21, then the chromosomal microarray will not be necessary.  Moving forward, we reviewed the following options for this pregnancy:  Continuation of the pregnancy with management as indicated with follow up ultrasound, fetal echocardiogram and monitoring as appropriate.  Termination of pregnancy until 259w6dn [place] (or later in other states)-  [facility]E [facility] of need for laminaria the day before, which she had prior to a D&C in 2021 for a miscarriage and is very anxious about.  Induction of labor  - admission to labor and delivery, may take longer but offers  the option of seeing and holding the baby, which [name] is an important consideration for her.  Continuation of the pregnancy with adoption of the baby at delivery.    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  However, she stated that she was not ready to make that decision today and will contact myself or Dawn Clossith her decision either way and we will make a plan for follow up as needed. The father of the baby asked questions about the diagnosis, life for persons with Down syndrome, and maternal health risks with termination.  He stated that he feels it is [name] to make but would be supportive of her choices. We will follow up with her once the final chromosome analysis is available. Dawn Alaminreviously gave the couple information on support groups for termination and loss.  We may be reached at [phone]/Thurs) or [phone] Dawn FinlayMS, CGC

## 2020-10-12 ENCOUNTER — Telehealth: Payer: Self-pay | Admitting: Genetic Counselor

## 2020-10-12 NOTE — Telephone Encounter (Signed)
Received a call from Ms. Mcglade and her husband informing me that they have decided to terminate the pregnancy. This has been a very difficult decision for them but one that they both feel confident in. After discussing with one another, the couple has opted to pursue a D&E procedure at [facility] over an induction of labor. The couple confirmed that they would like to proceed with scheduling an appointment for a D&E while karyotype results are still pending. They understand that while karyotype results are expected to confirm results from [facility], only karyotype can definitively confirm the diagnosis of trisomy 21. I informed them that we will call them once karyotype is available to review the results and recurrence risks for future pregnancies.   Ms. Sexson and I reviewed the consent form for [place]'s Women's Right to Know Act together over the phone today. She agreed to sign the form electronically and send it to me via email. She understands that legally, her procedure will have to occur at least 72 hours after we reviewed this form.  Ms. Bowland confirmed that she had no further questions at this time. Following our phone call, I contacted Dr. Ebbie Latus at [facility] who will reach out to the patient to get her scheduled for a D&E procedure. I will forward all of the necessary records and documents to Dr. Ammie Ferrier team. I also sent Ms. Higginbotham an email with a resource on D&E procedures from [employer] and encouraged her to contact me if she had further questions or if I could be helpful in any other way.  Buelah Manis, MS, [facility] Genetic Counselor

## 2020-10-23 ENCOUNTER — Telehealth: Payer: Self-pay | Admitting: Obstetrics and Gynecology

## 2020-11-01 NOTE — Telephone Encounter (Signed)
Left message for patient to check on her following her procedure and follow up on lab results.  Wilburt Finlay, MS, CGC

## 2020-11-02 LAB — INSIGHT AMNIO FISH XY,13,18,21
Cells Analyzed: 50
Cells Counted: 50

## 2020-11-02 LAB — CHROMOSOME, AFP, AMNIOTIC FL
AFP, Amniotic Fluid (mcg/ml): 7.8 ug/mL
Cells Analyzed: 15
Cells Counted: 15
Cells Karyotyped: 2
Colonies: 15
GTG Band Resolution Achieved: 450
Gestational Age(Wks): 16
MOM, Amniotic Fluid: 0.61

## 2020-11-02 LAB — MCC TRACKING

## 2020-11-02 LAB — MATERNAL CELL CONTAMINATION

## 2020-11-08 ENCOUNTER — Telehealth: Payer: Self-pay | Admitting: Obstetrics and Gynecology

## 2020-11-08 NOTE — Telephone Encounter (Signed)
I left a message for Dawn Kent again today to check on her and follow up on lab results.  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  We are happy to discuss these with her in detail if desired and can be reached at [phone].  Wilburt Finlay, MS, CGC
